# Patient Record
Sex: Female | Born: 1970 | Race: White | Hispanic: No | Marital: Married | Smoking: Never smoker
Health system: Southern US, Community
[De-identification: ages and names within clinical notes are randomized; demographics above are authoritative.]

## PROBLEM LIST (undated history)

## (undated) DIAGNOSIS — E785 Hyperlipidemia, unspecified: Secondary | ICD-10-CM

## (undated) HISTORY — DX: Hyperlipidemia, unspecified: E78.5

---

## 2016-04-24 ENCOUNTER — Other Ambulatory Visit: Payer: Self-pay | Admitting: Obstetrics & Gynecology

## 2016-04-24 DIAGNOSIS — N644 Mastodynia: Secondary | ICD-10-CM

## 2016-04-24 DIAGNOSIS — N6320 Unspecified lump in the left breast, unspecified quadrant: Secondary | ICD-10-CM

## 2016-04-24 DIAGNOSIS — N6310 Unspecified lump in the right breast, unspecified quadrant: Secondary | ICD-10-CM

## 2016-04-28 ENCOUNTER — Other Ambulatory Visit: Payer: Self-pay

## 2016-04-30 ENCOUNTER — Other Ambulatory Visit: Payer: Self-pay

## 2016-05-11 ENCOUNTER — Other Ambulatory Visit: Payer: Self-pay

## 2016-05-14 ENCOUNTER — Other Ambulatory Visit: Payer: Self-pay

## 2016-05-22 ENCOUNTER — Ambulatory Visit
Admission: RE | Admit: 2016-05-22 | Discharge: 2016-05-22 | Disposition: A | Discharge status: Home / Self Care | Payer: No Typology Code available for payment source | Source: Ambulatory Visit | Attending: Obstetrics & Gynecology | Admitting: Obstetrics & Gynecology

## 2016-05-22 DIAGNOSIS — N644 Mastodynia: Secondary | ICD-10-CM

## 2016-05-22 DIAGNOSIS — N6320 Unspecified lump in the left breast, unspecified quadrant: Secondary | ICD-10-CM

## 2016-05-22 DIAGNOSIS — N6310 Unspecified lump in the right breast, unspecified quadrant: Secondary | ICD-10-CM

## 2016-12-28 ENCOUNTER — Ambulatory Visit: Payer: BC Managed Care – PPO | Admitting: Family Medicine

## 2017-01-04 ENCOUNTER — Encounter: Payer: Self-pay | Admitting: Family Medicine

## 2017-01-04 ENCOUNTER — Ambulatory Visit (INDEPENDENT_AMBULATORY_CARE_PROVIDER_SITE_OTHER): Payer: BC Managed Care – PPO | Admitting: Family Medicine

## 2017-01-04 VITALS — BP 94/70 | HR 60 | Resp 12 | Ht 63.0 in | Wt 130.0 lb

## 2017-01-04 DIAGNOSIS — L709 Acne, unspecified: Secondary | ICD-10-CM | POA: Diagnosis not present

## 2017-01-04 DIAGNOSIS — Z8269 Family history of other diseases of the musculoskeletal system and connective tissue: Secondary | ICD-10-CM | POA: Insufficient documentation

## 2017-01-04 DIAGNOSIS — Z820 Family history of epilepsy and other diseases of the nervous system: Secondary | ICD-10-CM | POA: Insufficient documentation

## 2017-01-04 DIAGNOSIS — E785 Hyperlipidemia, unspecified: Secondary | ICD-10-CM | POA: Diagnosis not present

## 2017-01-04 DIAGNOSIS — R748 Abnormal levels of other serum enzymes: Secondary | ICD-10-CM | POA: Diagnosis not present

## 2017-01-04 LAB — LIPID PANEL
CHOLESTEROL: 223 mg/dL — AB (ref 0–200)
HDL: 39.6 mg/dL (ref >39.00)
LDL CALC: 156 mg/dL — AB (ref 0–99)
NonHDL: 183.55
TRIGLYCERIDES: 138 mg/dL (ref 0.0–149.0)
Total CHOL/HDL Ratio: 6
VLDL: 27.6 mg/dL (ref 0.0–40.0)

## 2017-01-04 LAB — COMPREHENSIVE METABOLIC PANEL
ALBUMIN: 4.4 g/dL (ref 3.5–5.2)
ALT: 10 U/L (ref 0–35)
AST: 18 U/L (ref 0–37)
Alkaline Phosphatase: 53 U/L (ref 39–117)
BUN: 10 mg/dL (ref 6–23)
CHLORIDE: 104 meq/L (ref 96–112)
CO2: 28 mEq/L (ref 19–32)
Calcium: 9.5 mg/dL (ref 8.4–10.5)
Creatinine, Ser: 0.91 mg/dL (ref 0.40–1.20)
GFR: 71.29 mL/min (ref >60.00)
Glucose, Bld: 92 mg/dL (ref 70–99)
POTASSIUM: 4.7 meq/L (ref 3.5–5.1)
Sodium: 138 mEq/L (ref 135–145)
Total Bilirubin: 0.6 mg/dL (ref 0.2–1.2)
Total Protein: 6.9 g/dL (ref 6.0–8.3)

## 2017-01-04 LAB — CK: Total CK: 77 U/L (ref 7–177)

## 2017-01-04 MED ORDER — SIMVASTATIN 20 MG PO TABS: 20.0000 mg | ORAL_TABLET | Freq: Every day | ORAL | 3 refills | Status: AC

## 2017-01-04 MED ORDER — DAPSONE 5 % EX GEL: 1.0000 "application " | Freq: Two times a day (BID) | CUTANEOUS | 6 refills | Status: AC

## 2017-01-04 NOTE — Progress Notes (Signed)
HPI:   [name] is a 46 y.o. female, who is here today to establish care with me.  Former PCP: Moved from [place] 03/2016. Last preventive routine visit: 07/2016.  Chronic medical problems: HLD.  She had labs done I 07/2016 with her gyn.  She is reporting mildly elevated FG,107.  Hyperlipidemia:  Currently on Zocor 20 mg daily,ran out a week ago. Following a low fat diet: Yes.  She has not noted side effects with medication.  She exercises regularly, hot yoga.  -About 2 years ago she had CK elevated, 400's, she is not sure about the reason this was checked.States that it was ordered after starting stains,denies myalgias.  -Facial acne: She uses Dapsone cream as needed,requesting refills. This was prescribed by dermatologists and she was following annually. She does not have any lesion at this time, intermittent and around chin. Medication is helping.   Review of Systems  Constitutional: Negative for activity change, appetite change, fatigue, fever and unexpected weight change.  HENT: Negative for mouth sores, nosebleeds and trouble swallowing.   Eyes: Negative for redness and visual disturbance.  Respiratory: Negative for cough, shortness of breath and wheezing.   Cardiovascular: Negative for chest pain, palpitations and leg swelling.  Gastrointestinal: Negative for abdominal pain, nausea and vomiting.       Negative for changes in bowel habits.  Endocrine: Negative for polydipsia, polyphagia and polyuria.  Genitourinary: Negative for decreased urine volume and hematuria.  Musculoskeletal: Negative for back pain and myalgias.  Skin: Negative for rash.  Neurological: Negative for seizures, syncope, weakness, numbness and headaches.  Psychiatric/Behavioral: Negative for confusion. The patient is not nervous/anxious.     No current outpatient prescriptions on file prior to visit.   No current facility-administered medications on file prior to visit.        Past Medical History:  Diagnosis Date  . Hyperlipidemia    Not on File  Family History  Problem Relation Age of Onset  . Heart disease Father     71  . Hyperlipidemia Father   . Hypertension Father   . Cancer Maternal Grandmother     breast    Social History   Social History  . Marital status: Married    Spouse name: N/A  . Number of children: N/A  . Years of education: N/A   Social History Main Topics  . Smoking status: Never Smoker  . Smokeless tobacco: Never Used  . Alcohol use None  . Drug use: Unknown  . Sexual activity: Not Asked   Other Topics Concern  . None   Social History Narrative  . None    Vitals:   01/04/17 0934  BP: 94/70  Pulse: 60  Resp: 12   O2 sat at RA 98%. Body mass index is 23.03 kg/m.   Physical Exam  Nursing note and vitals reviewed. Constitutional: She is oriented to person, place, and time. She appears well-developed and well-nourished. No distress.  HENT:  Head: Atraumatic.  Mouth/Throat: Oropharynx is clear and moist and mucous membranes are normal.  Eyes: Conjunctivae and EOM are normal. Pupils are equal, round, and reactive to light.  Neck: No tracheal deviation present. No thyroid mass and no thyromegaly present.  Cardiovascular: Normal rate and regular rhythm.   No murmur heard. Pulses:      Dorsalis pedis pulses are 2+ on the right side, and 2+ on the left side.  Respiratory: Effort normal and breath sounds normal. No respiratory distress.  GI: Soft.  She exhibits no mass. There is no hepatomegaly. There is no tenderness.  Musculoskeletal: She exhibits no edema or tenderness.  Lymphadenopathy:    She has no cervical adenopathy.  Neurological: She is alert and oriented to person, place, and time. She has normal strength. Coordination normal.  Skin: Skin is warm. No rash noted. No erythema.  Psychiatric: She has a normal mood and affect.  Well groomed, good eye contact.      ASSESSMENT AND  PLAN:   Aeon was seen today for establish care.  Diagnoses and all orders for this visit:  Elevated CK  Possible causes of elevated CK discussed,including statins. Asymptomatic. Further recommendations will be given according to lab results.  -     CK  Hyperlipidemia, unspecified hyperlipidemia type  No changes in current management, will follow labs done today and will give further recommendations accordingly. Some side effects of medication as well as benefits discussed. F/U in 6-12 months. Rx for Zocor sent.  -     Comprehensive metabolic panel -     Lipid panel -     simvastatin (ZOCOR) 20 MG tablet; Take 1 tablet (20 mg total) by mouth daily.  Acne, unspecified acne type  Dapsone is helping, I sent Rx. F/U in 12 months.  -     Dapsone (ACZONE) 5 % topical gel; Apply 1 application topically 2 (two) times daily.      Megan Parsley G. [place], MD  [facility]. [place] office.

## 2017-01-04 NOTE — Progress Notes (Signed)
Pre visit review using our clinic review tool, if applicable. No additional management support is needed unless otherwise documented below in the visit note. 

## 2017-01-04 NOTE — Patient Instructions (Addendum)
A few things to remember from today's visit:   Elevated CK - Plan: CK  Hyperlipidemia, unspecified hyperlipidemia type - Plan: Comprehensive metabolic panel, Lipid panel, simvastatin (ZOCOR) 20 MG tablet  Acne, unspecified acne type - Plan: Dapsone (ACZONE) 5 % topical gel   We have ordered labs or studies at this visit.  It can take up to 1-2 weeks for results and processing. IF results require follow up or explanation, we will call you with instructions. Clinically stable results will be released to your [facility]. If you have not heard from [place] or cannot find your results in [facility] in 2 weeks please contact our office at [phone].  If you are not yet signed up for [facility], please consider signing up  Please be sure medication list is accurate. If a new problem present, please set up appointment sooner than planned today.

## 2017-01-08 ENCOUNTER — Telehealth: Payer: Self-pay | Admitting: Family Medicine

## 2017-01-08 NOTE — Telephone Encounter (Signed)
° ° ° °  Pt would like a call abck about her lab results    [phone]

## 2017-01-08 NOTE — Telephone Encounter (Signed)
Informed patient of results and patient verbalized understanding.  

## 2017-01-18 ENCOUNTER — Ambulatory Visit (INDEPENDENT_AMBULATORY_CARE_PROVIDER_SITE_OTHER): Payer: BC Managed Care – PPO | Admitting: Family Medicine

## 2017-01-18 ENCOUNTER — Encounter: Payer: Self-pay | Admitting: Family Medicine

## 2017-01-18 VITALS — BP 118/70 | HR 61 | Resp 12 | Ht 63.0 in | Wt 130.4 lb

## 2017-01-18 DIAGNOSIS — R5383 Other fatigue: Secondary | ICD-10-CM | POA: Diagnosis not present

## 2017-01-18 DIAGNOSIS — G4700 Insomnia, unspecified: Secondary | ICD-10-CM | POA: Diagnosis not present

## 2017-01-18 DIAGNOSIS — E785 Hyperlipidemia, unspecified: Secondary | ICD-10-CM

## 2017-01-18 LAB — TSH: TSH: 2.96 u[IU]/mL (ref 0.35–4.50)

## 2017-01-18 LAB — T4, FREE: Free T4: 0.83 ng/dL (ref 0.60–1.60)

## 2017-01-18 NOTE — Progress Notes (Signed)
Pre visit review using our clinic review tool, if applicable. No additional management support is needed unless otherwise documented below in the visit note. 

## 2017-01-18 NOTE — Patient Instructions (Signed)
A few things to remember from today's visit:   Hyperlipidemia, unspecified hyperlipidemia type - Plan: TSH  Fatigue, unspecified type - Plan: TSH, T4, free   We have ordered labs or studies at this visit.  It can take up to 1-2 weeks for results and processing. IF results require follow up or explanation, we will call you with instructions. Clinically stable results will be released to your [facility]. If you have not heard from [place] or cannot find your results in [facility] in 2 weeks please contact our office at [phone].  If you are not yet signed up for [facility], please consider signing up  Please be sure medication list is accurate. If a new problem present, please set up appointment sooner than planned today.

## 2017-01-18 NOTE — Progress Notes (Signed)
HPI:   ACUTE VISIT:  Chief Complaint  Patient presents with  . discuss thyroid    [name] is a 46 y.o. female, who is here today requesting thyroid testing done. She is reporting prior Hx of hypothyroidism, between 27 and 50 yo. According to pt,  TSH was 2.5 at the time of her Dx. + Fatigue. + Insomnia, she wakes up frequently.  Denies depressed mood.  Also concerned about not been able to lose 5 lb despite increasing exercise intensity and followig a healthy diet.   Hyperlipidemia: Also concerned about recent FLP. Reports FLP done by her gyn in 07/2016,  LDL was 108. She is on Zocor 20 mg, which she was not taking for about 7-10 days at the time lab was done.  Following a low fat diet: Yes.    Lab Results  Component Value Date   CHOL 223 (H) 01/04/2017   HDL 39.60 01/04/2017   LDLCALC 156 (H) 01/04/2017   TRIG 138.0 01/04/2017   CHOLHDL 6 01/04/2017     Review of Systems  Constitutional: Positive for fatigue. Negative for activity change, appetite change, fever and unexpected weight change.  HENT: Negative for mouth sores, nosebleeds and trouble swallowing.   Respiratory: Negative for apnea and shortness of breath.   Cardiovascular: Negative for chest pain, palpitations and leg swelling.  Gastrointestinal: Negative for abdominal pain, nausea and vomiting.       Negative for changes in bowel habits.  Endocrine: Negative for cold intolerance and heat intolerance.  Musculoskeletal: Negative for myalgias and neck pain.  Skin: Negative for pallor and rash.  Neurological: Negative for syncope, weakness and headaches.  Psychiatric/Behavioral: Positive for sleep disturbance. Negative for confusion. The patient is nervous/anxious.       Current Outpatient Prescriptions on File Prior to Visit  Medication Sig Dispense Refill  . Dapsone (ACZONE) 5 % topical gel Apply 1 application topically 2 (two) times daily. 30 g 6  . LO LOESTRIN FE 1 MG-10 MCG /  10 MCG tablet Take 1 tablet by mouth daily.    . simvastatin (ZOCOR) 20 MG tablet Take 1 tablet (20 mg total) by mouth daily. 90 tablet 3   No current facility-administered medications on file prior to visit.      Past Medical History:  Diagnosis Date  . Hyperlipidemia    Not on File  Social History   Social History  . Marital status: Married    Spouse name: N/A  . Number of children: N/A  . Years of education: N/A   Social History Main Topics  . Smoking status: Never Smoker  . Smokeless tobacco: Never Used  . Alcohol use None  . Drug use: Unknown  . Sexual activity: Not Asked   Other Topics Concern  . None   Social History Narrative  . None    Vitals:   01/18/17 0920  BP: 118/70  Pulse: 61  Resp: 12   Body mass index is 23.09 kg/m.   Physical Exam  Nursing note and vitals reviewed. Constitutional: She is oriented to person, place, and time. She appears well-developed and well-nourished. No distress.  HENT:  Head: Atraumatic.  Mouth/Throat: Oropharynx is clear and moist and mucous membranes are normal.  Eyes: Conjunctivae and EOM are normal.  Neck: No tracheal deviation present. No thyroid mass and no thyromegaly present.  Cardiovascular: Normal rate and regular rhythm.   No murmur heard. Pulses:      Dorsalis pedis pulses are 2+ on the  right side, and 2+ on the left side.  Respiratory: Effort normal and breath sounds normal. No respiratory distress.  GI: Soft. She exhibits no mass. There is no hepatomegaly. There is no tenderness.  Musculoskeletal: She exhibits no edema or tenderness.  Lymphadenopathy:    She has no cervical adenopathy.  Neurological: She is alert and oriented to person, place, and time. She has normal strength. Gait normal.  Skin: Skin is warm. No erythema.  Psychiatric: Her mood appears anxious.  Well groomed, good eye contact.    ASSESSMENT AND PLAN:   Megan Sherman was seen today for discuss thyroid.  Diagnoses and all orders  for this visit:  Fatigue, unspecified type  We discussed possible etiologies, in her case insomnia can be aggravating problem. Further recommendations will be given according to labs results.  -     TSH -     T4, free  Hyperlipidemia, unspecified hyperlipidemia type  Continue low fat diet. We discussed the options of increasing Zocor from 20 mg to 40 mg, she prefers to hold until we re-check it again. Wine 2-4 Oz a few days per week may help with increasing HDL. F/U in 4-5 months.  -     TSH  Insomnia, unspecified type  Good sleep hygiene. We discussed some adverse effects of poor sleep. She could try OTC sleep aids if needed. F/U as needed.      Return in about 5 months (around 06/20/2017) for HLD.      Megan Sherman G. [place], MD  [facility]. [place] office.

## 2017-01-27 ENCOUNTER — Telehealth: Payer: Self-pay | Admitting: Family Medicine

## 2017-01-27 NOTE — Telephone Encounter (Signed)
Pt would like blood work result  °

## 2017-01-28 NOTE — Telephone Encounter (Signed)
Left voicemail letting patient know lab results were normal & to call back with any questions.

## 2018-03-14 IMAGING — MG 2D DIGITAL DIAGNOSTIC BILATERAL MAMMOGRAM WITH CAD AND ADJUNCT T
8 of 12 series · 8 of 28 positions shown · non-contrast
Comparison: Previous exam(s).

CLINICAL DATA: 44-year-old female presenting for evaluation of
diffuse right breast pain. One month ago, however this problem has
resolved. She also describes lumpiness in the lower-inner quadrant
of the right breast but states that this has always been present,
and is stable.

EXAM:
2D DIGITAL DIAGNOSTIC BILATERAL MAMMOGRAM WITH CAD AND ADJUNCT TOMO

[R MLO]
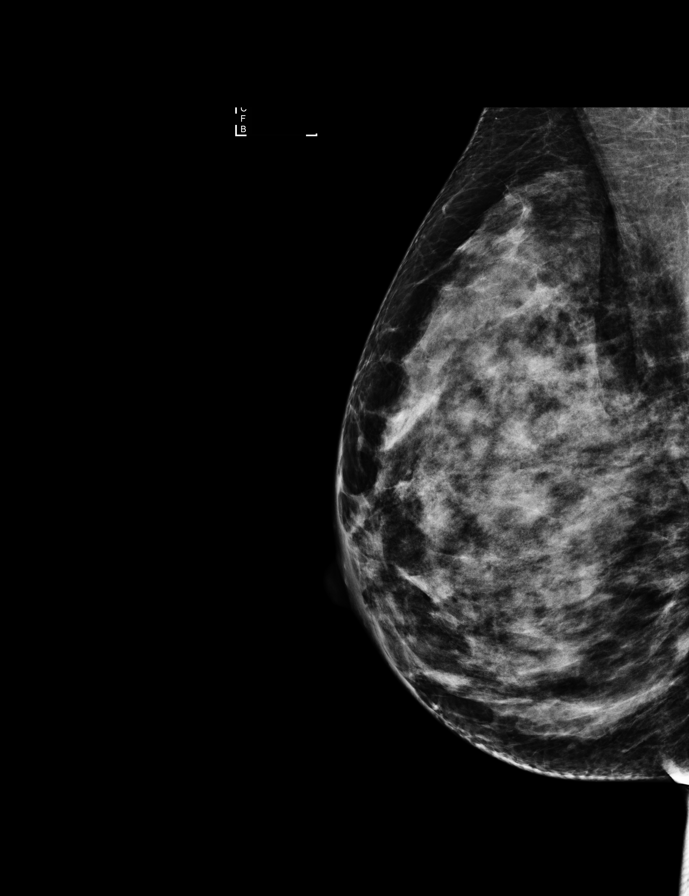

[L MLO]
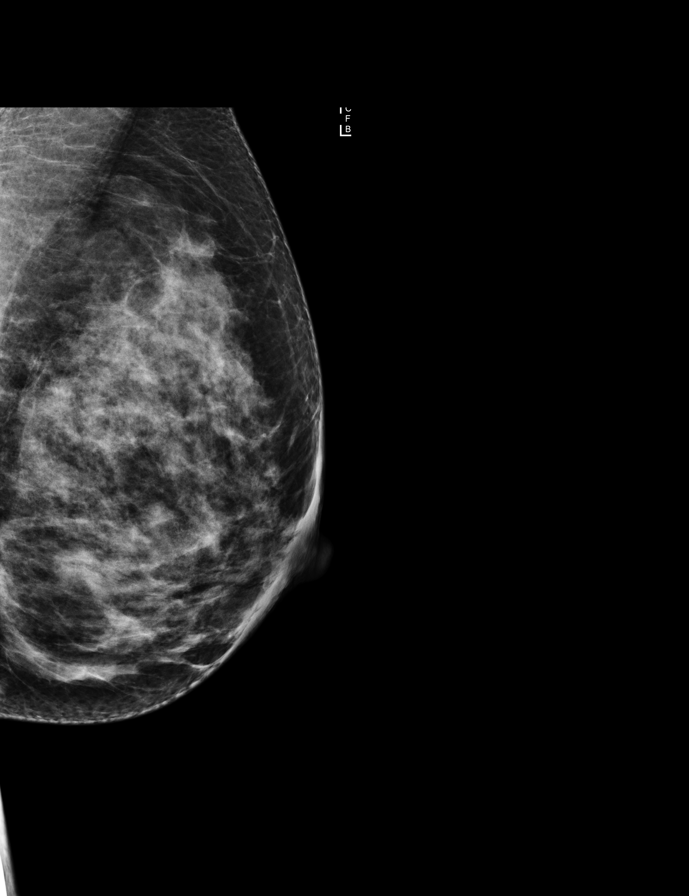

[R CC synth-2D]
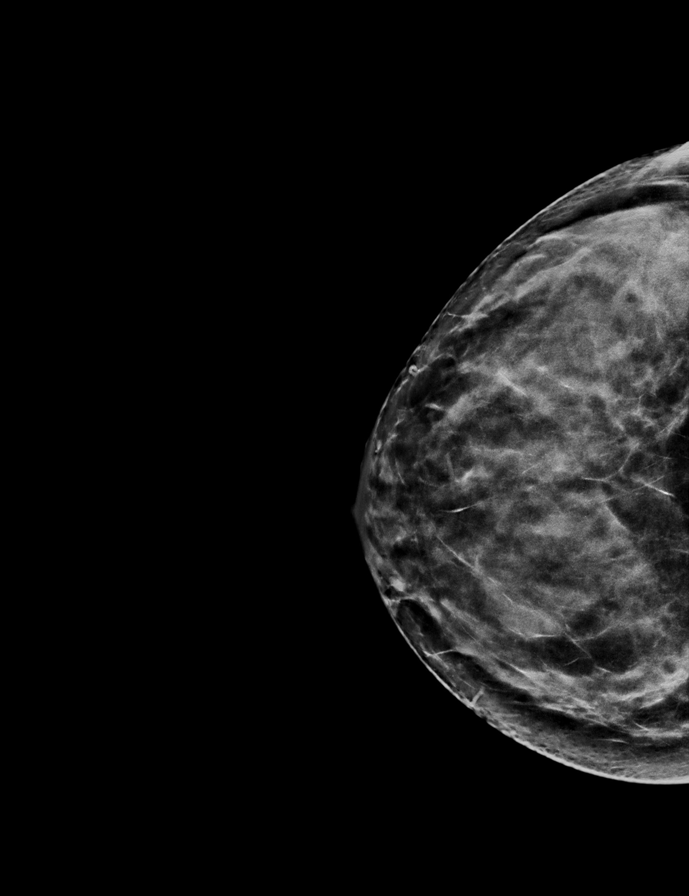

[L CC synth-2D]
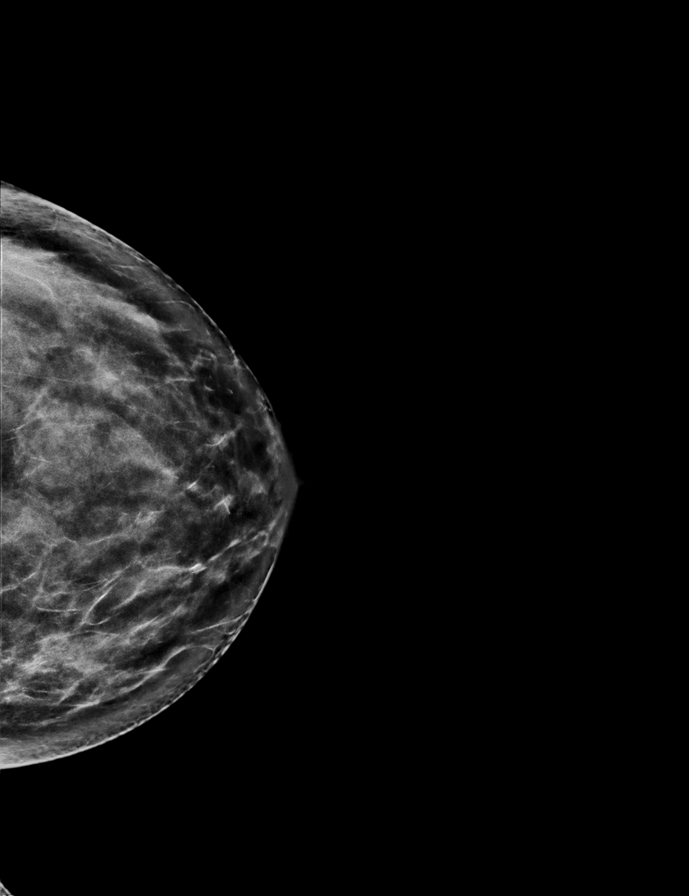

[L CC]
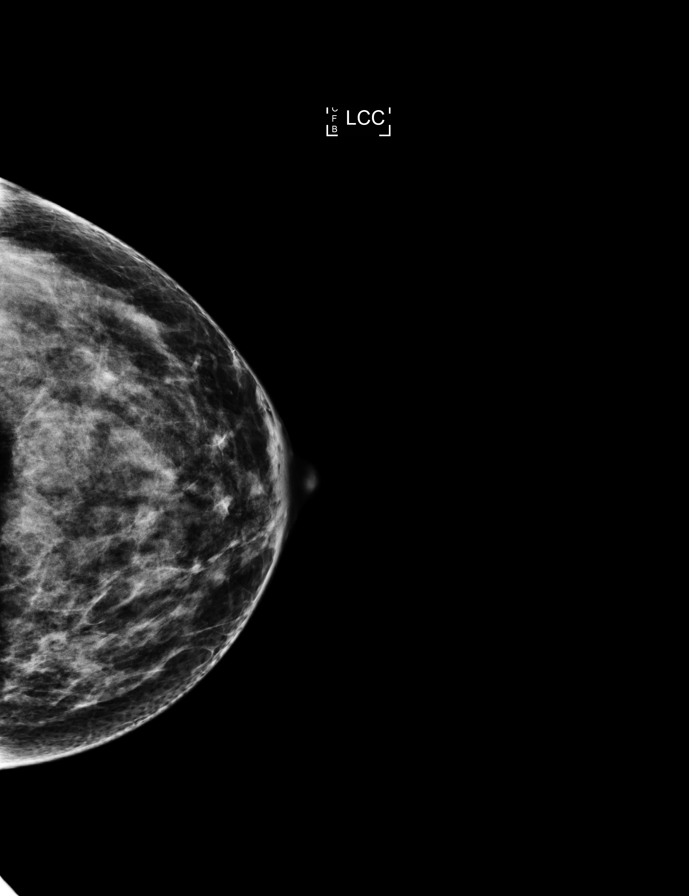

[R MLO synth-2D]
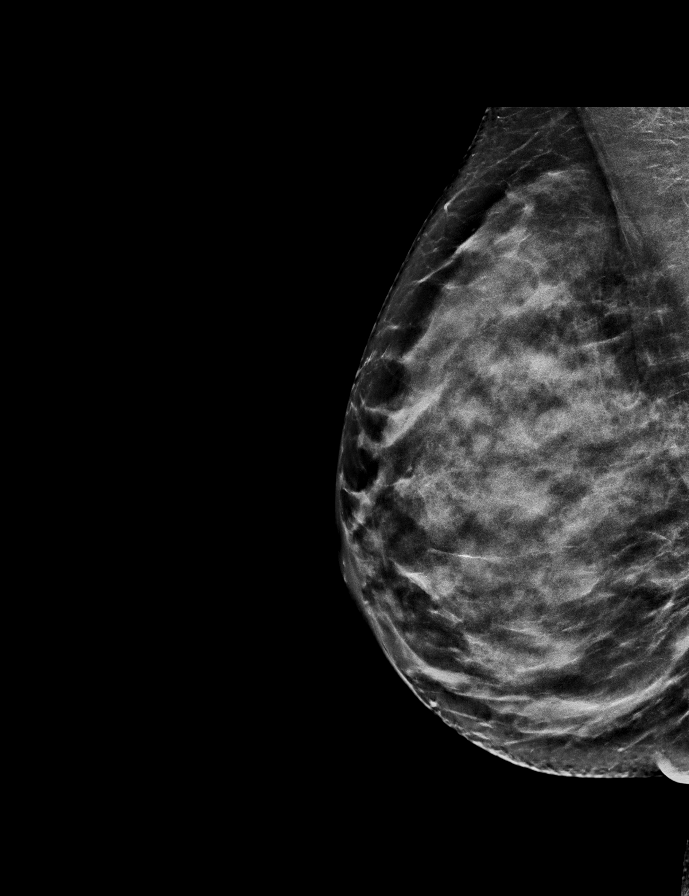

[R CC]
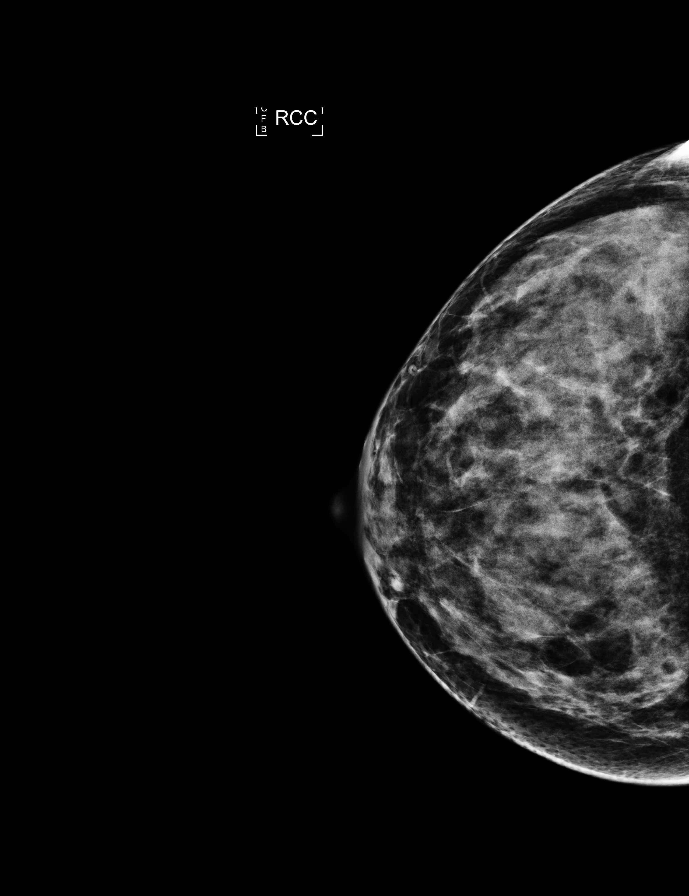

[L MLO synth-2D]
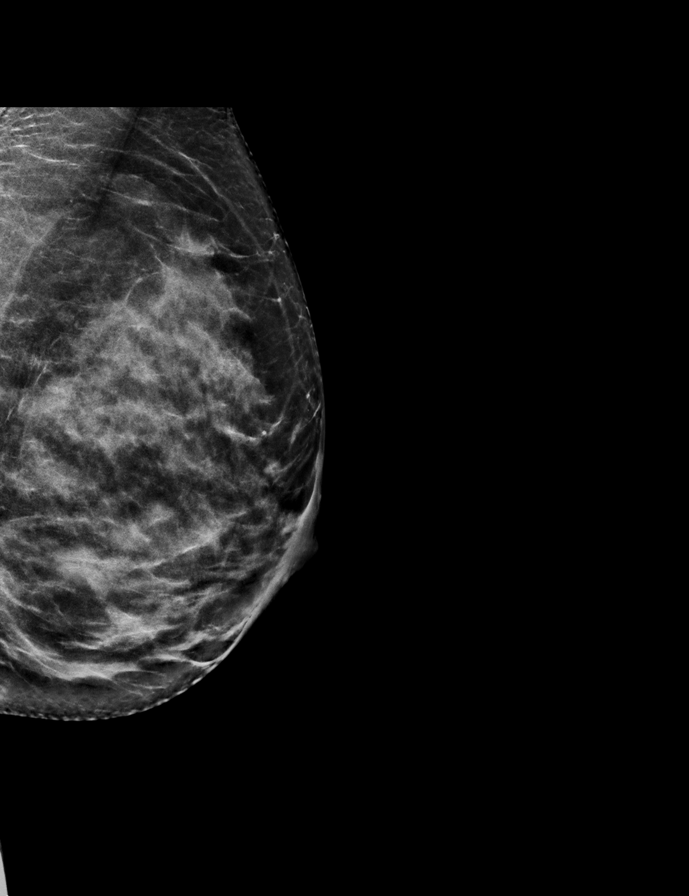

[8 of 28 positions shown; findings below may reference images not displayed]

ACR Breast Density Category d: The breast tissue is extremely dense,
which lowers the sensitivity of mammography.
FINDINGS: No suspicious calcifications, masses or areas of distortion are seen
in the bilateral breasts.

Mammographic images were processed with CAD.
IMPRESSION: 1. No mammographic findings to explain the patient's right breast
pain, which has now resolved.

2.  No mammographic evidence of malignancy in the bilateral breasts.

RECOMMENDATION:
1. Clinical follow-up recommended for the tender area of concern in
the right breast. Should this recur, any further workup should be
based on clinical grounds.

2.  Screening mammogram in one year.(Code:KY-V-DF3)

I have discussed the findings and recommendations with the patient.
Results were also provided in writing at the conclusion of the
visit. If applicable, a reminder letter will be sent to the patient
regarding the next appointment.

BI-RADS CATEGORY  1: Negative.
